# Patient Record
Sex: Male | Born: 1955 | Race: Black or African American | Hispanic: No | Marital: Married | Smoking: Never smoker
Health system: Southern US, Community
[De-identification: ages and names within clinical notes are randomized; demographics above are authoritative.]

## PROBLEM LIST (undated history)

## (undated) DIAGNOSIS — I10 Essential (primary) hypertension: Secondary | ICD-10-CM

## (undated) HISTORY — PX: VALVE REPLACEMENT: SUR13

## (undated) HISTORY — PX: OTHER SURGICAL HISTORY: SHX169

## (undated) HISTORY — PX: CARDIAC SURGERY: SHX584

---

## 2015-10-19 ENCOUNTER — Emergency Department (HOSPITAL_COMMUNITY)
Admission: EM | Admit: 2015-10-19 | Discharge: 2015-10-19 | Disposition: A | Discharge status: Home / Self Care | Payer: Self-pay | Attending: Emergency Medicine | Admitting: Emergency Medicine

## 2015-10-19 ENCOUNTER — Emergency Department (HOSPITAL_COMMUNITY): Payer: Self-pay

## 2015-10-19 ENCOUNTER — Encounter (HOSPITAL_COMMUNITY): Payer: Self-pay | Admitting: Emergency Medicine

## 2015-10-19 DIAGNOSIS — I10 Essential (primary) hypertension: Secondary | ICD-10-CM | POA: Insufficient documentation

## 2015-10-19 DIAGNOSIS — R1011 Right upper quadrant pain: Secondary | ICD-10-CM

## 2015-10-19 DIAGNOSIS — R112 Nausea with vomiting, unspecified: Secondary | ICD-10-CM | POA: Insufficient documentation

## 2015-10-19 DIAGNOSIS — K8020 Calculus of gallbladder without cholecystitis without obstruction: Secondary | ICD-10-CM | POA: Insufficient documentation

## 2015-10-19 DIAGNOSIS — Z79899 Other long term (current) drug therapy: Secondary | ICD-10-CM | POA: Insufficient documentation

## 2015-10-19 DIAGNOSIS — R109 Unspecified abdominal pain: Secondary | ICD-10-CM | POA: Insufficient documentation

## 2015-10-19 HISTORY — DX: Essential (primary) hypertension: I10

## 2015-10-19 LAB — COMPREHENSIVE METABOLIC PANEL
ALK PHOS: 67 U/L (ref 38–126)
ALT: 20 U/L (ref 17–63)
ANION GAP: 9 (ref 5–15)
AST: 37 U/L (ref 15–41)
Albumin: 3.9 g/dL (ref 3.5–5.0)
BUN: 11 mg/dL (ref 6–20)
CALCIUM: 9.3 mg/dL (ref 8.9–10.3)
CO2: 29 mmol/L (ref 22–32)
CREATININE: 1.26 mg/dL — AB (ref 0.61–1.24)
Chloride: 101 mmol/L (ref 101–111)
Glucose, Bld: 121 mg/dL — ABNORMAL HIGH (ref 65–99)
Potassium: 4 mmol/L (ref 3.5–5.1)
Sodium: 139 mmol/L (ref 135–145)
Total Bilirubin: 1.1 mg/dL (ref 0.3–1.2)
Total Protein: 6.9 g/dL (ref 6.5–8.1)

## 2015-10-19 LAB — URINALYSIS, ROUTINE W REFLEX MICROSCOPIC
Bilirubin Urine: NEGATIVE
GLUCOSE, UA: NEGATIVE mg/dL
HGB URINE DIPSTICK: NEGATIVE
KETONES UR: NEGATIVE mg/dL
LEUKOCYTES UA: NEGATIVE
NITRITE: NEGATIVE
PH: 7 (ref 5.0–8.0)
Protein, ur: NEGATIVE mg/dL
Specific Gravity, Urine: 1.01 (ref 1.005–1.030)

## 2015-10-19 LAB — LIPASE, BLOOD: LIPASE: 73 U/L — AB (ref 11–51)

## 2015-10-19 LAB — CBC
HCT: 45.5 % (ref 39.0–52.0)
Hemoglobin: 16 g/dL (ref 13.0–17.0)
MCH: 33.2 pg (ref 26.0–34.0)
MCHC: 35.2 g/dL (ref 30.0–36.0)
MCV: 94.4 fL (ref 78.0–100.0)
PLATELETS: 123 10*3/uL — AB (ref 150–400)
RBC: 4.82 MIL/uL (ref 4.22–5.81)
RDW: 13.1 % (ref 11.5–15.5)
WBC: 4.4 10*3/uL (ref 4.0–10.5)

## 2015-10-19 LAB — TROPONIN I: Troponin I: <0.03 ng/mL (ref <0.031)

## 2015-10-19 MED ORDER — ONDANSETRON HCL 4 MG PO TABS
4.0000 mg | ORAL_TABLET | Freq: Four times a day (QID) | ORAL | Status: AC
Start: 2015-10-19 — End: ?

## 2015-10-19 MED ORDER — OMEPRAZOLE 20 MG PO CPDR: 20.0000 mg | DELAYED_RELEASE_CAPSULE | Freq: Every day | ORAL | Status: AC

## 2015-10-19 MED ORDER — IOHEXOL 300 MG/ML  SOLN
100.0000 mL | Freq: Once | INTRAMUSCULAR | Status: AC | PRN
  Administered 2015-10-19: 100 mL via INTRAVENOUS

## 2015-10-19 NOTE — ED Notes (Signed)
Pt seen in [facility] ED last night. Pt left without being seen. Pt had lab work drawn.

## 2015-10-19 NOTE — ED Notes (Addendum)
C/o mid abd pain, nausea, and vomiting intermittently since having a valve replacement 1 year ago.  States symptoms worse since Sunday morning.  Pt states he has been to doctor numerous times for same.

## 2015-10-19 NOTE — ED Notes (Signed)
Pt told registration that he was leaving because he has to be at court in [place] at 8am.

## 2015-10-19 NOTE — ED Provider Notes (Signed)
CSN: [id number]     Arrival date & time 10/19/15  1439 History   First MD Initiated Contact with Patient 10/19/15 1513     Chief Complaint  Patient presents with  . Abdominal Pain     (Consider location/radiation/quality/duration/timing/severity/associated sxs/prior Treatment) HPI Comments: Patient is a poor historian.  States he has been having RUQ and epigastric pain intermittently since valve replacement surgery in May 2016 in [place].  Unsure what valve was replaced.  He is not on coumadin or other meds. He denies chest pain or SOB.  He was seen at [facility] last night and  [facility] after labs obtained.  He states the pain is not daily but occurs several times during a week. Seems to be worse with eating and drinking. Nausea but no vomiting. No fever. No urinary symptoms. No testicular pain. He has reportedly seen a "stomach specialist" but does not know what he was told or what testing he has had done.  The history is provided by the patient.    Past Medical History  Diagnosis Date  . Hypertension    Past Surgical History  Procedure Laterality Date  . Other surgical history      "valve repair"- pt unsure what type of valve repair  . Cardiac surgery    . Valve replacement     No family history on file. Social History  Substance Use Topics  . Smoking status: Never Smoker   . Smokeless tobacco: None  . Alcohol Use: Yes    Review of Systems  Constitutional: Positive for activity change and appetite change. Negative for fever and fatigue.  HENT: Negative for congestion and rhinorrhea.   Respiratory: Negative for cough, chest tightness and shortness of breath.   Cardiovascular: Negative for chest pain.  Gastrointestinal: Positive for nausea and abdominal pain. Negative for vomiting and diarrhea.  Genitourinary: Negative for dysuria, urgency and hematuria.  Musculoskeletal: Negative for myalgias and arthralgias.  Skin: Negative for rash.  Neurological: Negative for dizziness,  weakness and light-headedness.  A complete 10 system review of systems was obtained and all systems are negative except as noted in the HPI and PMH.      Allergies  Review of patient's allergies indicates no known allergies.  Home Medications   Prior to Admission medications   Medication Sig Start Date End Date Taking? Authorizing Provider  atenolol (TENORMIN) 50 MG tablet Take 50 mg by mouth daily.   Yes Historical Provider, MD  carvedilol (COREG) 25 MG tablet Take 25 mg by mouth daily. 07/23/15  Yes Historical Provider, MD  hydrochlorothiazide (HYDRODIURIL) 25 MG tablet Take 25 mg by mouth daily.   Yes Historical Provider, MD  omeprazole (PRILOSEC) 20 MG capsule Take 1 capsule (20 mg total) by mouth daily. 10/19/15   Glynn OctaveStephen Arieanna Pressey, MD  ondansetron (ZOFRAN) 4 MG tablet Take 1 tablet (4 mg total) by mouth every 6 (six) hours. 10/19/15   Glynn OctaveStephen Aracelly Tencza, MD   BP 177/119 mmHg  Pulse 59  Temp(Src) 97.9 F (36.6 C) (Oral)  Resp 18  Ht 5\' 10"  (1.778 m)  Wt 225 lb (102.059 kg)  BMI 32.28 kg/m2  SpO2 100% Physical Exam  Constitutional: He is oriented to person, place, and time. He appears well-developed and well-nourished. No distress.  talkative  HENT:  Head: Normocephalic and atraumatic.  Mouth/Throat: Oropharynx is clear and moist. No oropharyngeal exudate.  Eyes: Conjunctivae and EOM are normal. Pupils are equal, round, and reactive to light.  Neck: Normal range of motion. Neck supple.  No meningismus.  Cardiovascular: Normal rate, regular rhythm, normal heart sounds and intact distal pulses.   No murmur heard. Pulmonary/Chest: Effort normal and breath sounds normal. No respiratory distress. He exhibits no tenderness.  Abdominal: Soft. There is tenderness. There is guarding. There is no rebound.  TTP epigastric and RUQ tenderness with guarding  Firm nodule R groin.  (states chronic since valve replacement)  Musculoskeletal: Normal range of motion. He exhibits no edema or  tenderness.  Neurological: He is alert and oriented to person, place, and time. No cranial nerve deficit. He exhibits normal muscle tone. Coordination normal.  No ataxia on finger to nose bilaterally. No pronator drift. 5/5 strength throughout. CN 2-12 intact.Equal grip strength. Sensation intact.   Skin: Skin is warm.  Psychiatric: He has a normal mood and affect. His behavior is normal.  Nursing note and vitals reviewed.   ED Course  Procedures (including critical care time) Labs Review Labs Reviewed  URINALYSIS, ROUTINE W REFLEX MICROSCOPIC (NOT AT [facility])  TROPONIN I    Imaging Review Ct Abdomen Pelvis W Contrast  10/19/2015  CLINICAL DATA:  Right upper quadrant and epigastric abdominal pain. EXAM: CT ABDOMEN AND PELVIS WITH CONTRAST TECHNIQUE: Multidetector CT imaging of the abdomen and pelvis was performed using the standard protocol following bolus administration of intravenous contrast. CONTRAST:  OMNIPAQUE IOHEXOL 300 MG/ML  SOLN COMPARISON:  10/19/2015 abdominal sonogram . FINDINGS: Lower chest: No significant pulmonary nodules or acute consolidative airspace disease. Mitral annuloplasty ring is partially visualized. Hepatobiliary: Diffuse hepatic steatosis. Granulomatous calcification in the right liver lobe. No liver mass. Subcentimeter layering calcified gallstones in the gallbladder, with no gallbladder wall thickening or pericholecystic fluid. No biliary ductal dilatation. Pancreas: Normal, with no mass or duct dilation. Spleen: Normal size. No mass. Adrenals/Urinary Tract: Normal adrenals. Normal kidneys with no hydronephrosis and no renal mass. Normal bladder. Stomach/Bowel: Grossly normal stomach. Normal caliber small bowel with no small bowel wall thickening. Normal appendix. Normal large bowel with no diverticulosis, large bowel wall thickening or pericolonic fat stranding. Vascular/Lymphatic: Atherosclerotic nonaneurysmal abdominal aorta. Patent portal, splenic, hepatic and  renal veins. No pathologically enlarged lymph nodes in the abdomen or pelvis. Reproductive: Normal size prostate. Other: No pneumoperitoneum, ascites or focal fluid collection. There is a 4.6 x 3.6 cm mildly thick walled fluid collection in the subcutaneous right inguinal soft tissues overlying the right common femoral vessels with associated surrounding surgical clips. Musculoskeletal: No aggressive appearing focal osseous lesions. Moderate degenerative changes in the visualized thoracolumbar spine. IMPRESSION: 1. Cholelithiasis. No CT findings to suggest acute cholecystitis. No biliary ductal dilatation. 2. Diffuse hepatic steatosis. 3. Mildly thick walled 4.6 x 3.6 cm subcutaneous right inguinal fluid collection overlying the right common femoral vessels with surrounding surgical clips, probably a postprocedural hematoma/seroma, correlate with clinical exam. 4. No evidence of bowel obstruction or acute bowel inflammation. Normal appendix. Electronically Signed   By: Delbert Phenix M.D.   On: 10/19/2015 18:08   US Abdomen Limited Ruq  10/19/2015  CLINICAL DATA:  60 year old male with history of breath with abdominal pain for the past 3 months. EXAM: US ABDOMEN LIMITED - RIGHT UPPER QUADRANT COMPARISON:  No priors. FINDINGS: Gallbladder: Multiple small echogenic foci with posterior acoustic shadowing lying in the dependent portion of the gallbladder extending into the neck of the gallbladder, compatible with gallstones. Gallbladder wall thickness is normal at 2.8 mm. Gallbladder does not appear distended. No pericholecystic fluid. Per report from the sonographer, the patient did not exhibit a sonographic Murphy's sign  on examination. Common bile duct: Diameter: 3.8 mm in the porta hepatis Liver: Slightly nodular liver contour with slight heterogeneity throughout the hepatic parenchyma, suggesting underlying cirrhosis. The dominant hepatic lesion noted. Normal hepatopetal flow in the portal vein. IMPRESSION: 1.  Cholelithiasis without evidence of acute cholecystitis at this time. 2. The appearance of the liver suggests underlying cirrhosis. Electronically Signed   By: Trudie Reed M.D.   On: 10/19/2015 16:08   I have personally reviewed and evaluated these images and lab results as part of my medical decision-making.   EKG Interpretation   Date/Time:  Monday October 19 2015 15:37:15 EDT Ventricular Rate:  55 PR Interval:  138 QRS Duration: 104 QT Interval:  447 QTC Calculation: 427 R Axis:   74 Text Interpretation:  Sinus rhythm Borderline ST elevation, anterior leads  No previous ECGs available Confirmed by Manus Gunning  MD, Aigner Horseman [phone]) on  10/19/2015 3:48:32 PM      MDM   Final diagnoses:  RUQ pain  Cholelithiasis without cholecystitis  Essential hypertension   Ongoing RUQ and epigastric abdominal pain since May 2016.  Has had seen a specialist but cannot tell me what he was told or what testing he had done. No chest pain, back pain or SOB.  Labs from earlier today showed minimally elevated lipase 71. Normal LFTs. Attempted to get records from [facility].  None on record.  US shows gallstones without cholecystitis.   BP elevated in the ED.  States he did not take his medications today.  Stressed compliance. Denies headache, vision change, chest pain. Labs without evidence of end organ damage.  Patient referred to general surgery for elective cholecystectomy. Return precautions discussed.  Glynn Octave, MD 10/20/15 [phone]

## 2015-10-19 NOTE — ED Notes (Signed)
Pt reports generalized abdominal pain that has been going on since May 2016 after his heart surgery. Pt states he has been to GI doctors with no answers. Pt reports ongoing n/v/d. Denies urinary symptoms.

## 2015-10-19 NOTE — Discharge Instructions (Signed)
Biliary Colic \\Followup  with Dr. Lovell SheehanJenkins for your gallbladder evaluation. Take your blood pressure medications as prescribed. Return to the ED if you develop new or worsening symptoms. Biliary colic is a pain in the upper abdomen. The pain:  Is usually felt on the right side of the abdomen, but it may also be felt in the center of the abdomen, just below the breastbone (sternum).  May spread back toward the right shoulder blade.  May be steady or irregular.  May be accompanied by nausea and vomiting. Most of the time, the pain goes away in 1-5 hours. After the most intense pain passes, the abdomen may continue to ache mildly for about 24 hours. Biliary colic is caused by a blockage in the bile duct. The bile duct is a pathway that carries bile--a liquid that helps to digest fats--from the gallbladder to the small intestine. Biliary colic usually occurs after eating, when the digestive system demands bile. The pain develops when muscle cells contract forcefully to try to move the blockage so that bile can get by. HOME CARE INSTRUCTIONS  Take medicines only as directed by your health care provider.  Drink enough fluid to keep your urine clear or pale yellow.  Avoid fatty, greasy, and fried foods. These kinds of foods increase your body's demand for bile.  Avoid any foods that make your pain worse.  Avoid overeating.  Avoid having a large meal after fasting. SEEK MEDICAL CARE IF:  You develop a fever.  Your pain gets worse.  You vomit.  You develop nausea that prevents you from eating and drinking. SEEK IMMEDIATE MEDICAL CARE IF:  You suddenly develop a fever and shaking chills.  You develop a yellowish discoloration (jaundice) of:  Skin.  Whites of the eyes.  Mucous membranes.  You have continuous or severe pain that is not relieved with medicines.  You have nausea and vomiting that is not relieved with medicines.  You develop dizziness or you faint.   This  information is not intended to replace advice given to you by your health care provider. Make sure you discuss any questions you have with your health care provider.   Document Released: 12/19/2005 Document Revised: 12/02/2014 Document Reviewed: 04/29/2014 Elsevier Interactive Patient Education [employer]2016 [employer]

## 2016-09-09 ENCOUNTER — Emergency Department (HOSPITAL_COMMUNITY)
Admission: EM | Admit: 2016-09-09 | Discharge: 2016-09-09 | Disposition: A | Discharge status: Home / Self Care | Payer: Self-pay | Attending: Dermatology | Admitting: Dermatology

## 2016-09-09 DIAGNOSIS — I10 Essential (primary) hypertension: Secondary | ICD-10-CM | POA: Insufficient documentation

## 2016-09-09 DIAGNOSIS — R200 Anesthesia of skin: Secondary | ICD-10-CM | POA: Insufficient documentation

## 2016-09-09 DIAGNOSIS — Z5321 Procedure and treatment not carried out due to patient leaving prior to being seen by health care provider: Secondary | ICD-10-CM | POA: Insufficient documentation

## 2016-09-09 DIAGNOSIS — Z79899 Other long term (current) drug therapy: Secondary | ICD-10-CM | POA: Insufficient documentation

## 2016-09-09 NOTE — ED Notes (Signed)
No response to call 

## 2017-09-06 IMAGING — CT CT ABD-PELV W/ CM
2 of 5 series · 16 of 46 positions shown, 18 images · IV contrast (Omnipaque 300)
Comparison: 10/19/2015 abdominal sonogram .

CLINICAL DATA: Right upper quadrant and epigastric abdominal pain.

EXAM:
CT ABDOMEN AND PELVIS WITH CONTRAST
TECHNIQUE: Multidetector CT imaging of the abdomen and pelvis was performed
using the standard protocol following bolus administration of
intravenous contrast.
CONTRAST:  100mL OMNIPAQUE IOHEXOL 300 MG/ML  SOLN

[Series 2: abd_pel_with 5.0 b40f · axial · 0.81mm/px · z∈[-488,-98]mm · 13 of 90 slices shown, 15 images]
[im 6/90  soft-tissue]
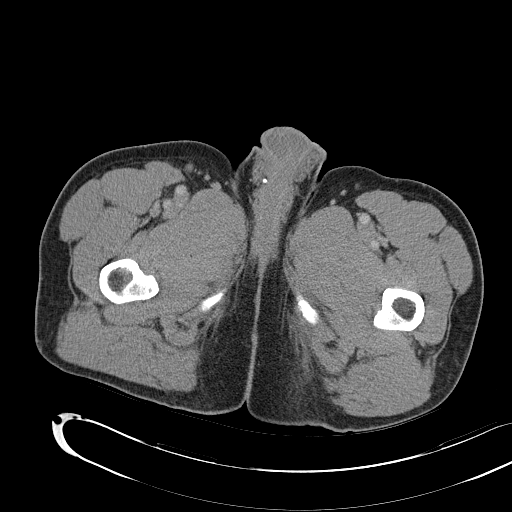
[im 6/90  bone]
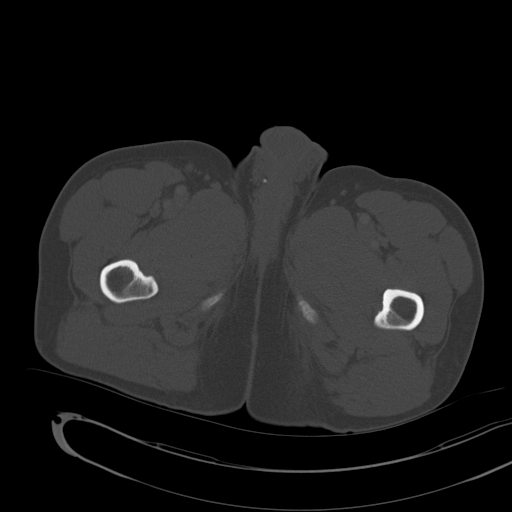
[im 11/90  soft-tissue]
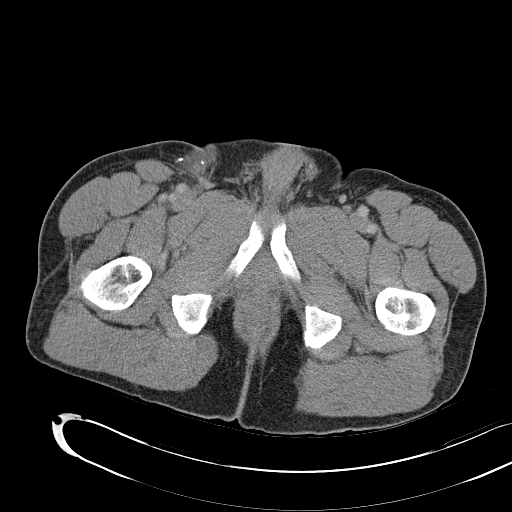
[im 21/90  soft-tissue]
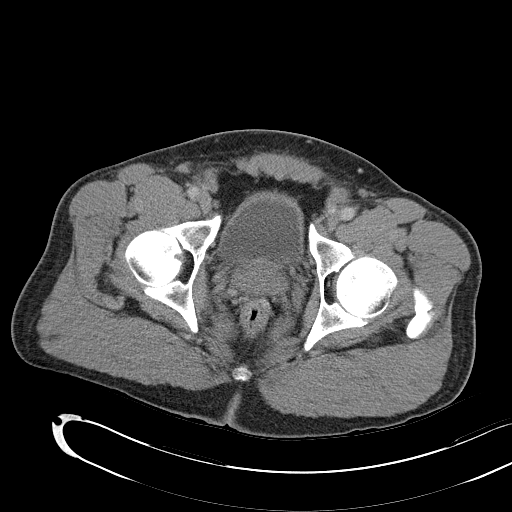
[im 27/90  soft-tissue]
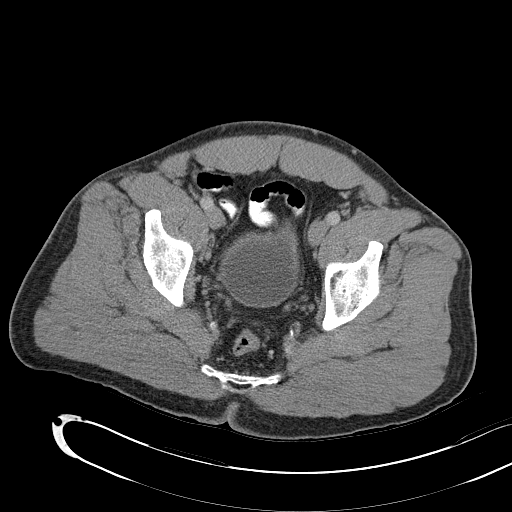
[im 32/90  soft-tissue]
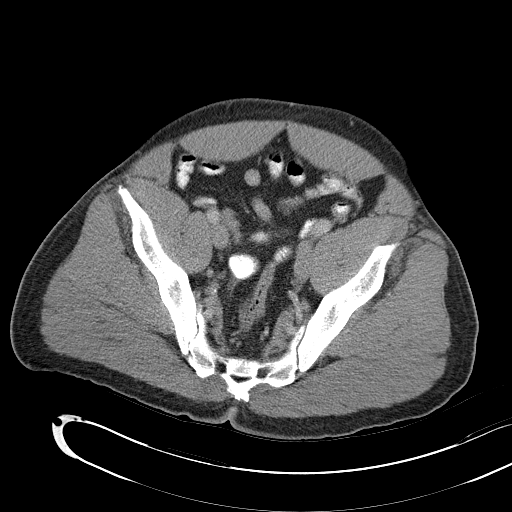
[im 37/90  soft-tissue]
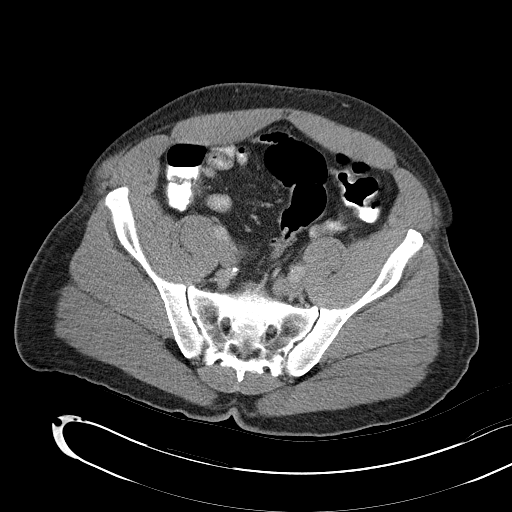
[im 48/90  soft-tissue]
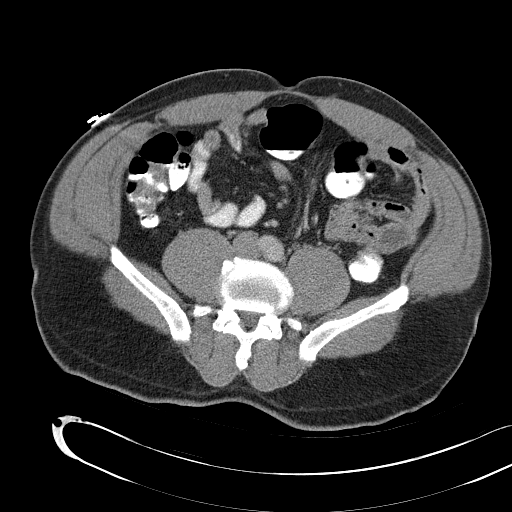
[im 53/90  soft-tissue]
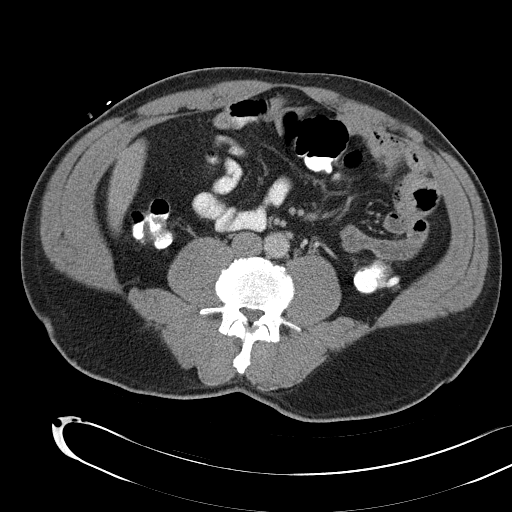
[im 58/90  soft-tissue]
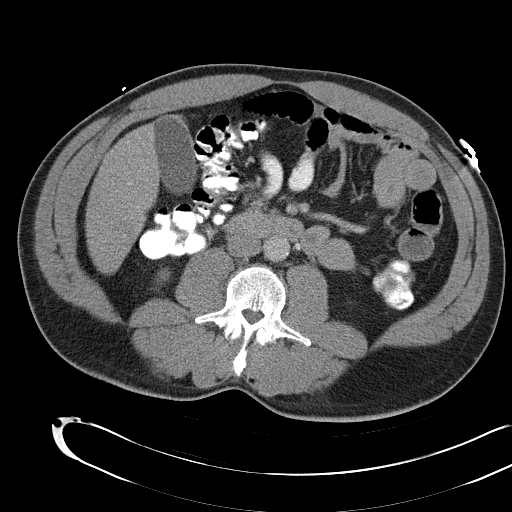
[im 58/90  bone]
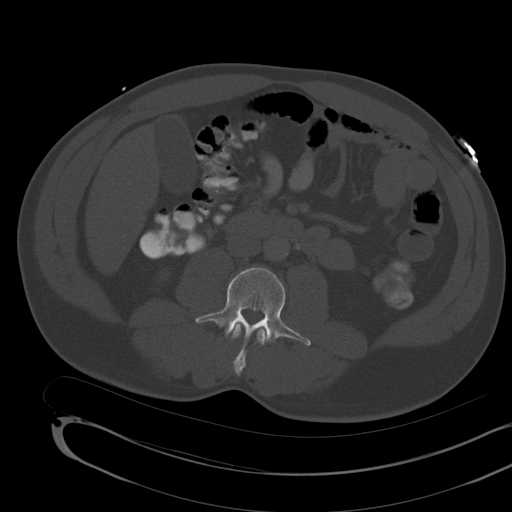
[im 63/90  soft-tissue]
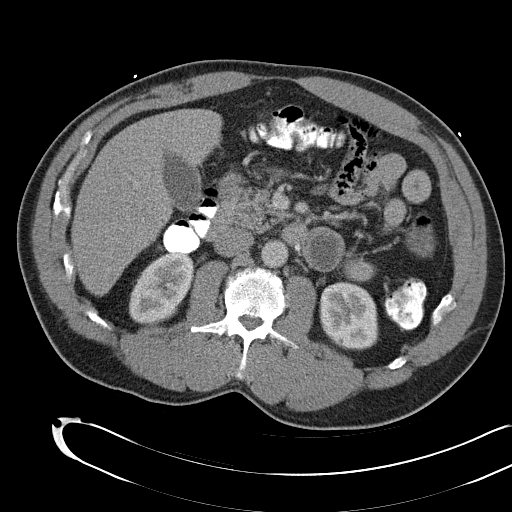
[im 69/90  soft-tissue]
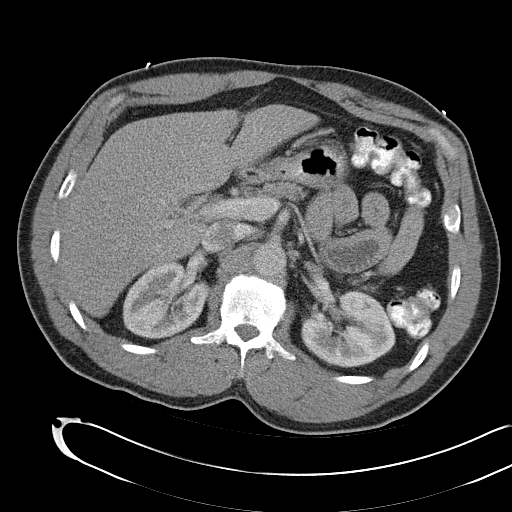
[im 79/90  soft-tissue]
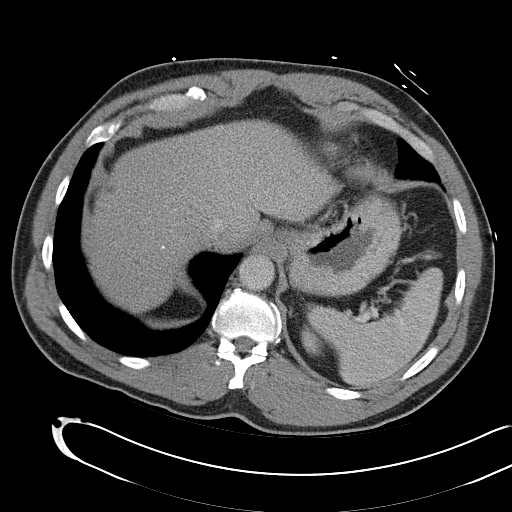
[im 84/90  soft-tissue]
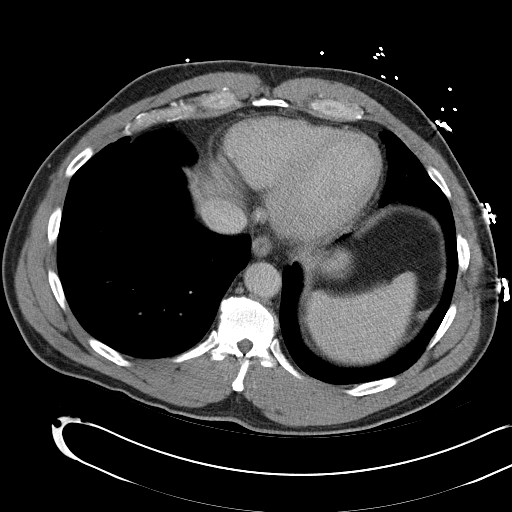

[Series 4: abd_pel_with 3.0 spo · coronal · 0.82mm/px · 3 of 98 slices shown]
[im 33/98  soft-tissue]
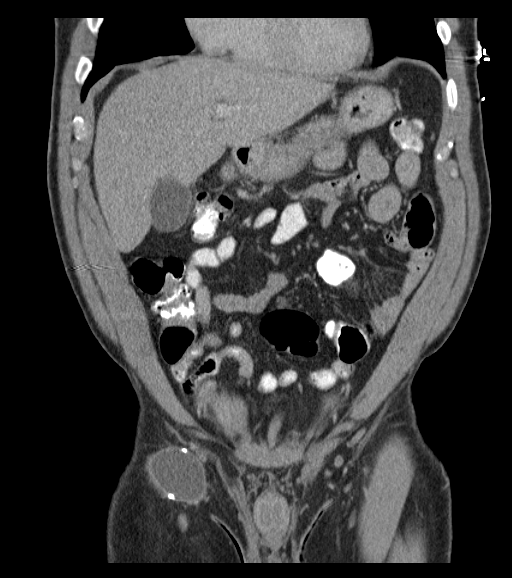
[im 44/98  soft-tissue]
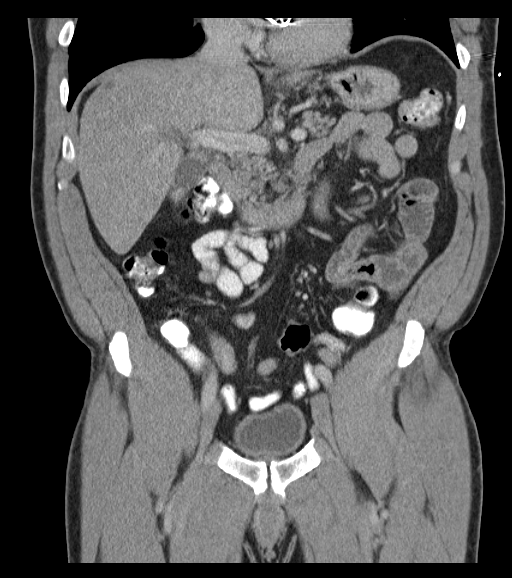
[im 54/98  soft-tissue]
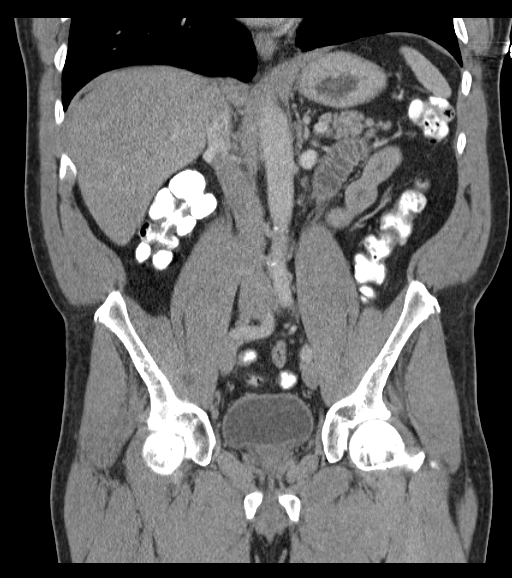

[16 of 46 positions shown; findings below may reference images not displayed]

FINDINGS: Lower chest: No significant pulmonary nodules or acute consolidative
airspace disease. Mitral annuloplasty ring is partially visualized.

Hepatobiliary: Diffuse hepatic steatosis. Granulomatous
calcification in the right liver lobe. No liver mass. Subcentimeter
layering calcified gallstones in the gallbladder, with no
gallbladder wall thickening or pericholecystic fluid. No biliary
ductal dilatation.

Pancreas: Normal, with no mass or duct dilation.

Spleen: Normal size. No mass.

Adrenals/Urinary Tract: Normal adrenals. Normal kidneys with no
hydronephrosis and no renal mass. Normal bladder.

Stomach/Bowel: Grossly normal stomach. Normal caliber small bowel
with no small bowel wall thickening. Normal appendix. Normal large
bowel with no diverticulosis, large bowel wall thickening or
pericolonic fat stranding.

Vascular/Lymphatic: Atherosclerotic nonaneurysmal abdominal aorta.
Patent portal, splenic, hepatic and renal veins. No pathologically
enlarged lymph nodes in the abdomen or pelvis.

Reproductive: Normal size prostate.

Other: No pneumoperitoneum, ascites or focal fluid collection. There
is a 4.6 x 3.6 cm mildly thick walled fluid collection in the
subcutaneous right inguinal soft tissues overlying the right common
femoral vessels with associated surrounding surgical clips.

Musculoskeletal: No aggressive appearing focal osseous lesions.
Moderate degenerative changes in the visualized thoracolumbar spine.
IMPRESSION: 1. Cholelithiasis. No CT findings to suggest acute cholecystitis. No
biliary ductal dilatation.
2. Diffuse hepatic steatosis.
3. Mildly thick walled 4.6 x 3.6 cm subcutaneous right inguinal
fluid collection overlying the right common femoral vessels with
surrounding surgical clips, probably a postprocedural
hematoma/seroma, correlate with clinical exam.
4. No evidence of bowel obstruction or acute bowel inflammation.
Normal appendix.
